# Patient Record
Sex: Male | Born: 1960 | Race: White | Hispanic: No | Smoking: Former smoker
Health system: Southern US, Community
[De-identification: ages and names within clinical notes are randomized; demographics above are authoritative.]

## PROBLEM LIST (undated history)

## (undated) DIAGNOSIS — I10 Essential (primary) hypertension: Secondary | ICD-10-CM

## (undated) HISTORY — DX: Essential (primary) hypertension: I10

---

## 1999-11-24 ENCOUNTER — Emergency Department (HOSPITAL_COMMUNITY): Admission: EM | Admit: 1999-11-24 | Discharge: 1999-11-24 | Payer: Self-pay | Admitting: Emergency Medicine

## 1999-11-30 ENCOUNTER — Ambulatory Visit (HOSPITAL_BASED_OUTPATIENT_CLINIC_OR_DEPARTMENT_OTHER): Admission: RE | Admit: 1999-11-30 | Discharge: 1999-11-30 | Payer: Self-pay | Admitting: *Deleted

## 2015-04-25 ENCOUNTER — Encounter (HOSPITAL_COMMUNITY): Payer: Self-pay | Admitting: *Deleted

## 2015-04-25 ENCOUNTER — Emergency Department (HOSPITAL_COMMUNITY)
Admission: EM | Admit: 2015-04-25 | Discharge: 2015-04-25 | Disposition: A | Discharge status: Home / Self Care | Payer: Managed Care, Other (non HMO) | Source: Home / Self Care | Attending: Family Medicine | Admitting: Family Medicine

## 2015-04-25 DIAGNOSIS — L255 Unspecified contact dermatitis due to plants, except food: Secondary | ICD-10-CM | POA: Diagnosis not present

## 2015-04-25 MED ORDER — TRIAMCINOLONE ACETONIDE 40 MG/ML IJ SUSP
INTRAMUSCULAR | Status: AC
  Filled 2015-04-25: qty 1

## 2015-04-25 MED ORDER — METHYLPREDNISOLONE ACETATE 80 MG/ML IJ SUSP
INTRAMUSCULAR | Status: AC
  Filled 2015-04-25: qty 1

## 2015-04-25 MED ORDER — TRIAMCINOLONE ACETONIDE 40 MG/ML IJ SUSP
40.0000 mg | Freq: Once | INTRAMUSCULAR | Status: AC
  Administered 2015-04-25: 40 mg via INTRAMUSCULAR

## 2015-04-25 MED ORDER — METHYLPREDNISOLONE ACETATE 40 MG/ML IJ SUSP
80.0000 mg | Freq: Once | INTRAMUSCULAR | Status: AC
  Administered 2015-04-25: 80 mg via INTRAMUSCULAR

## 2015-04-25 MED ORDER — HYDROXYZINE HCL 25 MG PO TABS: 25.0000 mg | ORAL_TABLET | Freq: Four times a day (QID) | ORAL | Status: AC

## 2015-04-25 NOTE — ED Notes (Signed)
Pt reports   Symptoms  Of   Rash      X  3-4   Weeks      Symptoms  Not  releived  By  [facility]   meds        Pt  Sitting  Upright on the  Exam table  Speaking  In   Complete   sentances

## 2015-04-25 NOTE — ED Provider Notes (Signed)
CSN: [id number]     Arrival date & time 04/25/15  1304 History   First MD Initiated Contact with Patient 04/25/15 1329     Chief Complaint  Patient presents with  . Rash   (Consider location/radiation/quality/duration/timing/severity/associated sxs/prior Treatment) Patient is a 54 y.o. male presenting with rash. The history is provided by the patient.  Rash Location:  Full body Quality: dryness, itchiness and redness   Severity:  Mild Onset quality:  Gradual Duration:  3 weeks Progression:  Unchanged Chronicity:  New Context comment:  Onset after mowing grass sev weeks ag Ineffective treatments:  Topical steroids Associated symptoms: no fever     History reviewed. No pertinent past medical history. History reviewed. No pertinent past surgical history. History reviewed. No pertinent family history. History  Substance Use Topics  . Smoking status: Never Smoker   . Smokeless tobacco: Not on file  . Alcohol Use: Yes    Review of Systems  Constitutional: Negative.  Negative for fever.  Skin: Positive for rash.    Allergies  Review of patient's allergies indicates no known allergies.  Home Medications   Prior to Admission medications   Medication Sig Start Date End Date Taking? Authorizing Provider  hydrOXYzine (ATARAX/VISTARIL) 25 MG tablet Take 1 tablet (25 mg total) by mouth every 6 (six) hours. Prn itching 04/25/15   Linna Hoff, MD   BP 173/128 mmHg  Pulse 101  Temp(Src) 98.8 F (37.1 C) (Oral)  Resp 16  SpO2 96% Physical Exam  Constitutional: He is oriented to person, place, and time. He appears well-developed and well-nourished.  Neck: Normal range of motion. Neck supple.  Musculoskeletal: He exhibits no tenderness.  Neurological: He is alert and oriented to person, place, and time.  Skin: Skin is warm and dry. Rash noted. There is erythema.  Scattered patchy erythema pruritis .  Nursing note and vitals reviewed.   ED Course  Procedures (including  critical care time) Labs Review Labs Reviewed - No data to display  Imaging Review No results found.   MDM   1. Contact dermatitis due to plants, except food       Linna Hoff, MD 04/25/15 1339

## 2020-03-12 ENCOUNTER — Ambulatory Visit (HOSPITAL_COMMUNITY)
Admission: EM | Admit: 2020-03-12 | Discharge: 2020-03-12 | Disposition: A | Discharge status: Home / Self Care | Payer: 59 | Attending: Registered Nurse | Admitting: Registered Nurse

## 2020-03-12 ENCOUNTER — Other Ambulatory Visit: Payer: Self-pay

## 2020-03-12 DIAGNOSIS — F1020 Alcohol dependence, uncomplicated: Secondary | ICD-10-CM | POA: Diagnosis not present

## 2020-03-12 NOTE — BH Assessment (Signed)
Comprehensive Clinical Assessment (CCA) Note  03/12/2020 Barry Peterson [id number]  Patient is a 59 year old male presenting voluntarily to [facility] for assessment at request of his 2 adult daughters. Patient reports depressive symptoms x 1 month. He states "I just lay on the couch and don't want to get up." Patient denies SI/ HI/ AVH. Patient reports drinking 4 alcoholic beverages daily after work and more on weekends. He denies any other substance use. Patient denies any prior mental health or substance use treatment. Patient denies trauma history.  Per Assunta Found, NP patient does not meet in patient criteria and is psych cleared. Patient to follow up with outpatient.  Visit Diagnosis:    MDD, single episode, moderate   ICD-10-CM   1. Alcohol use disorder, moderate, in controlled environment, dependence (HCC)  F10.20      CCA Screening, Triage and Referral (STR)  Patient Reported Information How did you hear about [place]? Family/Friend  Referral name: No data recorded Referral phone number: No data recorded  Whom do you see for routine medical problems? Primary Care  Practice/Facility Name: Dr. Lupe Carney at [facility]  Practice/Facility Phone Number: No data recorded Name of Contact: No data recorded Contact Number: No data recorded Contact Fax Number: No data recorded Prescriber Name: No data recorded Prescriber Address (if known): No data recorded  What Is the Reason for Your Visit/Call Today? Depression  How Long Has This Been Causing You Problems? 1 wk - 1 month  What Do You Feel Would Help You the Most Today? Therapy   Have You Recently Been in Any Inpatient Treatment (Hospital/Detox/Crisis Center/28-Day Program)? No  Name/Location of Program/Hospital:No data recorded How Long Were You There? No data recorded When Were You Discharged? No data recorded  Have You Ever Received Services From [facility] Before? No  Who Do You See at [facility]? No data  recorded  Have You Recently Had Any Thoughts About Hurting Yourself? No  Are You Planning to Commit Suicide/Harm Yourself At This time? No   Have you Recently Had Thoughts About Hurting Someone Karolee Ohs? No  Explanation: No data recorded  Have You Used Any Alcohol or Drugs in the Past 24 Hours? Yes  How Long Ago Did You Use Drugs or Alcohol? 1900  What Did You Use and How Much? 4 drinks   Do You Currently Have a Therapist/Psychiatrist? No  Name of Therapist/Psychiatrist: No data recorded  Have You Been Recently Discharged From Any Office Practice or Programs? No  Explanation of Discharge From Practice/Program: No data recorded    CCA Screening Triage Referral Assessment Type of Contact: Face-to-Face  Is this Initial or Reassessment? No data recorded Date Telepsych consult ordered in [facility]:  No data recorded Time Telepsych consult ordered in [facility]:  No data recorded  Patient Reported Information Reviewed? Yes  Patient Left Without Being Seen? No data recorded Reason for Not Completing Assessment: No data recorded  Collateral Involvement: daughters Mylo Red and Isabel Caprice   Does Patient Have a Automotive engineer Guardian? No data recorded Name and Contact of Legal Guardian: No data recorded If Minor and Not Living with Parent(s), Who has Custody? No data recorded Is CPS involved or ever been involved? Never  Is APS involved or ever been involved? Never   Patient Determined To Be At Risk for Harm To Self or Others Based on Review of Patient Reported Information or Presenting Complaint? No  Method: No data recorded Availability of Means: No data recorded Intent: No  data recorded Notification Required: No data recorded Additional Information for Danger to Others Potential: No data recorded Additional Comments for Danger to Others Potential: No data recorded Are There Guns or Other Weapons in Your Home? No data recorded Types of Guns/Weapons: No data  recorded Are These Weapons Safely Secured?                            No data recorded Who Could Verify You Are Able To Have These Secured: No data recorded Do You Have any Outstanding Charges, Pending Court Dates, Parole/Probation? No data recorded Contacted To Inform of Risk of Harm To Self or Others: No data recorded  Location of Assessment: [facility]   Does Patient Present under Involuntary Commitment? No  IVC Papers Initial File Date: No data recorded  [place] of Residence: [place]   Patient Currently Receiving the Following Services: Not Receiving Services   Determination of Need: Routine (7 days)   Options For Referral: Outpatient Therapy     CCA Biopsychosocial  Intake/Chief Complaint:  CCA Intake With Chief Complaint CCA Part Two Date: 03/12/20 Chief Complaint/Presenting Problem: Depression Patient's Currently Reported Symptoms/Problems: NA Individual's Strengths: NA Individual's Preferences: NA Individual's Abilities: NA Type of Services Patient Feels Are Needed: therapy  Mental Health Symptoms Depression:  Depression: Change in energy/activity, Difficulty Concentrating, Fatigue, Increase/decrease in appetite, Irritability, Sleep (too much or little), Duration of symptoms greater than two weeks  Mania:  Mania: None  Anxiety:   Anxiety: None  Psychosis:  Psychosis: None  Trauma:  Trauma: None  Obsessions:  Obsessions: None  Compulsions:  Compulsions: None  Inattention:  Inattention: None  Hyperactivity/Impulsivity:  Hyperactivity/Impulsivity: N/A  Oppositional/Defiant Behaviors:  Oppositional/Defiant Behaviors: N/A  Emotional Irregularity:  Emotional Irregularity: N/A  Other Mood/Personality Symptoms:  Other Mood/Personality Symptoms: Depressed mood   Mental Status Exam Appearance and self-care  Stature:  Stature: Average  Weight:  Weight: Average weight  Clothing:  Clothing: Casual  Grooming:  Grooming: Well-groomed  Cosmetic use:   Cosmetic Use: None  Posture/gait:  Posture/Gait: Normal  Motor activity:  Motor Activity: Not Remarkable  Sensorium  Attention:  Attention: Normal  Concentration:  Concentration: Normal  Orientation:  Orientation: X5  Recall/memory:  Recall/Memory: Normal  Affect and Mood  Affect:  Affect: Depressed  Mood:  Mood: Depressed  Relating  Eye contact:  Eye Contact: Avoided  Facial expression:  Facial Expression: Depressed  Attitude toward examiner:  Attitude Toward Examiner: Cooperative  Thought and Language  Speech flow: Speech Flow: Clear and Coherent  Thought content:  Thought Content: Appropriate to Mood and Circumstances  Preoccupation:  Preoccupations: None  Hallucinations:  Hallucinations: None  Organization:     Transport planner of Knowledge:  Fund of Knowledge: Average  Intelligence:  Intelligence: Average  Abstraction:  Abstraction: Normal  Judgement:  Judgement: Normal  Reality Testing:  Reality Testing: Realistic  Insight:  Insight: Fair  Decision Making:  Decision Making: Normal  Social Functioning  Social Maturity:  Social Maturity: Isolates  Social Judgement:  Social Judgement: Normal  Stress  Stressors:  Stressors: Family conflict, Grief/losses, Work  Coping Ability:  Coping Ability: Normal  Skill Deficits:  Skill Deficits: Self-care  Supports:  Supports: Friends/Service system     Religion: Religion/Spirituality Are You A Religious Person?:  (NA) How Might This Affect Treatment?: NA  Leisure/Recreation: Leisure / Recreation Do You Have Hobbies?:  (not assessed)  Exercise/Diet: Exercise/Diet Do You Exercise?:  (not assessed)  Have You Gained or Lost A Significant Amount of Weight in the Past Six Months?: No Do You Follow a Special Diet?:  (not assessed) Do You Have Any Trouble Sleeping?: Yes Explanation of Sleeping Difficulties: sometimes very little, other times sleeps all day   CCA Employment/Education  Employment/Work  Situation: Employment / Work Situation Employment situation: Employed 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 How long has patient been employed?: not assessed Patient's job has been impacted by current illness: No What is the longest time patient has a held a job?: not assessed Where was the patient employed at that time?: not assessed Has patient ever been in the [employer]?: No  Education: Education Is Patient Currently Attending School?: No Name of [employer] School: not assessed Did Garment/textile technologist From [employer]?: Yes Did Designer, television/film set?: No Did You Have An Individualized Education Program (IIEP):  (NA) Did You Have Any Difficulty At School?:  (NA) Patient's Education Has Been Impacted by Current Illness:  (NA)   CCA Family/Childhood History  Family and Relationship History: Family history Marital status: Widowed Widowed, when?: 7 yeras ago Are you sexually active?:  (not assessed) What is your sexual orientation?: not assessed Has your sexual activity been affected by drugs, alcohol, medication, or emotional stress?: not assessed Does patient have children?: Yes How many children?: 2 How is patient's relationship with their children?: children are supportive and visit on the weekends  Childhood History:  Childhood History By whom was/is the patient raised?:  (not assessed) Additional childhood history information: NA Description of patient's relationship with caregiver when they were a child: NA Patient's description of current relationship with people who raised him/her: NA How were you disciplined when you got in trouble as a child/adolescent?: NA Does patient have siblings?:  (NA) Did patient suffer any verbal/emotional/physical/sexual abuse as a child?: No Did patient suffer from severe childhood neglect?: No Has patient ever been sexually abused/assaulted/raped as an adolescent or adult?: No Was the patient ever a victim of a crime or a  disaster?: No Witnessed domestic violence?:  (not assessed) Has patient been affected by domestic violence as an adult?:  (not assessed)  Child/Adolescent Assessment:     CCA Substance Use  Alcohol/Drug Use: Alcohol / Drug Use Pain Medications: see MAR Prescriptions: see MAR Over the Counter: see MAR History of alcohol / drug use?: Yes Substance #1 Name of Substance 1: Alcohol 1 - Age of First Use: UTA 1 - Amount (size/oz): 4 beers 1 - Frequency: daily 1 - Duration: 1 month 1 - Last Use / Amount: 4 beers 6/16                       ASAM's:  Six Dimensions of Multidimensional Assessment  Dimension 1:  Acute Intoxication and/or Withdrawal Potential:      Dimension 2:  Biomedical Conditions and Complications:      Dimension 3:  Emotional, Behavioral, or Cognitive Conditions and Complications:     Dimension 4:  Readiness to Change:     Dimension 5:  Relapse, Continued use, or Continued Problem Potential:     Dimension 6:  Recovery/Living Environment:     ASAM Severity Score:    ASAM Recommended Level of Treatment:     Substance use Disorder (SUD)    Recommendations for Services/Supports/Treatments: Recommendations for Services/Supports/Treatments Recommendations For Services/Supports/Treatments: Individual Therapy  DSM5 Diagnoses: Patient Active Problem List   Diagnosis Date Noted  . Alcohol use disorder, moderate, in controlled environment,  dependence (HCC) 03/12/2020    Patient Centered Plan: Patient is on the following Treatment Plan(s):      Celedonio Miyamoto

## 2020-03-12 NOTE — ED Provider Notes (Signed)
Behavioral Health Medical Screening Exam  Barry Peterson is a 59 y.o. male patient presented to [facility] as a walk in brought in by his daughters with complaints that his daughters have been concerned and think something is wrong with him.  Patient stating that there is some isolation but as he has gotten older he doesn't go out as much.  States that he drinks 2-3 beers every evening after work and drinks more on week ends; but denies alcohol withdrawal symptoms.  Patient states that he has some depression but doesn't think that is the problem.  Patient gave permission to speak to his daughters for collateral information (sitting in lobby).   During evaluation Barry Peterson is alert/oriented x 4; calm/cooperative; and mood is congruent with affect.  He does not appear to be responding to internal/external stimuli or delusional thoughts.  Patient denies suicidal/self-harm/homicidal ideation, psychosis, and paranoia.  Patient answered question appropriately. Patient daughters states that patient has increased alcohol intake and feel that he may be doing other drugs.  States that he has been out of work for 2 weeks and has brought homeless women home to stay with him.  States he is not the person he use to be; "he is not cleaning his house, he never wants to go out.  He is just different. But the change has been over a period of time."  Both daughters don't feel that patient is a harm to himself or others just want him to get help for alcohol and substance use.      Total Time spent with patient: 45 minutes  Psychiatric Specialty Exam  Presentation  General Appearance:Appropriate for Environment;Casual  Eye Contact:Good  Speech:Clear and Coherent;Normal Rate  Speech Volume:Normal  Handedness:Right   Mood and Affect  Mood:Anxious;Depressed (Patient rating depression and anxiety 2-3 on scale 0/none and 10/worst)  Affect:Appropriate;Congruent   Thought Process  Thought Processes:Coherent;Goal  Directed  Descriptions of Associations:Intact  Orientation:Full (Time, Place and Person)  Thought Content:Logical;WDL  Hallucinations:None  Ideas of Reference:None  Suicidal Thoughts:No  Homicidal Thoughts:No   Sensorium  Memory:Immediate Good;Recent Good;Remote Good  Judgment:Intact  Insight:Good   Executive Functions  Concentration:Good  Attention Span:Good  Recall:Good  Fund of Knowledge:Good  Language:Good   Psychomotor Activity  Psychomotor Activity:Normal   Assets  Assets:Communication Skills;Desire for Improvement;Housing;Social Support;Financial Resources/Insurance   Sleep  Sleep:Fair  Number of hours: No data recorded  Physical Exam: Physical Exam ROS Blood pressure 110/82, pulse (!) 106, temperature (!) 97.1 F (36.2 C), temperature source Tympanic, resp. rate 20, height 5\' 10"  (1.778 m), weight 179 lb (81.2 kg), SpO2 98 %. Body mass index is 25.68 kg/m.  Musculoskeletal: Strength & Muscle Tone: within normal limits Gait & Station: normal Patient leans: N/A   Recommendations:  Outpatient psychiatric services and substance use services  Based on my evaluation the patient does not appear to have an emergency medical condition.    Follow-up Information    [place] [facility] Follow up.   Specialty: Behavioral Health Contact information: [address] [place] [phone]             Disposition:  Psychiatrically cleared No evidence of imminent risk to self or others at present.   Patient does not meet criteria for psychiatric inpatient admission. Supportive therapy provided about ongoing stressors. Discussed crisis plan, support from social network, calling 911, coming to the Emergency Department, and calling Suicide Hotline.  Timiyah Romito, NP 03/12/2020, 1:19 PM

## 2020-03-12 NOTE — ED Notes (Signed)
Patient belongings in locker 29 

## 2020-03-12 NOTE — Discharge Instructions (Addendum)
Patient to follow up with [facility].

## 2020-03-27 ENCOUNTER — Other Ambulatory Visit: Payer: Self-pay

## 2020-03-27 ENCOUNTER — Ambulatory Visit (INDEPENDENT_AMBULATORY_CARE_PROVIDER_SITE_OTHER): Payer: 59 | Admitting: Licensed Clinical Social Worker

## 2020-03-27 ENCOUNTER — Encounter (HOSPITAL_COMMUNITY): Payer: Self-pay | Admitting: Licensed Clinical Social Worker

## 2020-03-27 DIAGNOSIS — F1020 Alcohol dependence, uncomplicated: Secondary | ICD-10-CM | POA: Diagnosis not present

## 2020-03-27 DIAGNOSIS — F320 Major depressive disorder, single episode, mild: Secondary | ICD-10-CM | POA: Diagnosis not present

## 2020-03-27 NOTE — Progress Notes (Signed)
Comprehensive Clinical Assessment (CCA) Note  03/27/2020 Barry Peterson [id number]  Visit Diagnosis:      ICD-10-CM   1. Current mild episode of major depressive disorder, unspecified whether recurrent ([employer])  F32.0   2. Alcohol use disorder, moderate, in controlled environment, dependence [facility])  F10.20     Client is a 59 year old Male. Client is referred by [facility] for a MDD, and alcohol use disorder.   Client states mental health symptoms as evidenced by  Drinking daily (3 to 4 beers), Racing thoughts, sadness, anxious, lack of social interests, insomnia, Irresponsibility (3 months ago brought home homeless person and had sexual relationship throughout a period of 1 week), grief, Fatigue, and decrease concentration  Client denies suicidal and homicidal ideations  Client denies hallucinations and delusions   Client was screened for the following SDOH: smoking, exercise, family/social interest, depression and drinking   Assessment Information that integrates subjective and objective details with a therapist's professional interpretation:   LCSW and pt met for 60 min initial evaluation. Barry Peterson was alert and oriented x 5, dressed in work Nurse, mental health, and had flat affect/mood. Pt engaged in assessment.    Pt reports that pt was brought in by his daughter to [facility] 2 weeks ago. Barry Peterson states that he has made some poor choices in the past explaining that three months ago he brought home a homeless person gave her housing and did state he had sexual relations with her, however enforces the fact he used safe sex practices. Barry Peterson states that his daughter believe he has drug problems because the lady he brought home smoked cocaine. Pt declines use for any other substance besides alcohol. LCSW discussed pt need for continued drinking states he has 3 to 4 beers after work Mon-Friday with an increase of intake on the weekends 4 to 6 which reoccurs weekly. [name] reports he does not drink enough to have withdrawal  symptoms and very rarely gets "hungover". Primary need for Won is to process the grief he feels for his wife he lost 7 years ago. States " I really just have not been able to get over the thought of it" Pt continued to say that "Even when I had brought home the homeless lady, I sometimes called her Barry Peterson my wife's name".    Client meets criteria for Mild depression, alcohol use disorder, and unspecified bipolar disorder.     Client states use of the following substances: alcohol   Therapist addressed (substance use) concern, although client meets criteria, he/ she reports they do not wish to pursue tx at this time although therapist feels they would benefit from [place] counseling. (IF CLIENT HAS A S/A PROBLEM)   Treatment recommendations are include plan Pt wants to process spouses death, creating coping mechanisms, and address depression.   Elevate mood and show evidence of usual energy, activities, and socialization level.; Develop healthy interpersonal relationships that lead to alleviation and help prevent the relapse of depression symptoms; Develop healthy cognitive patterns and beliefs about self and the world that lead to alleviation and help prevent the relapse of depression symptoms; Appropriately grieve the loss in order to normalize mood and to return to previous adaptive level of functioning.; Appropriately grieve the loss of a spouse in order to normalize mood and to return to previous adaptive level of functioning. Verbally identify, if possible, the source of depressed mood; Discuss the nature of the relationship with the deceased significant other, reminiscing about a time spent together; Begin to experience sadness in session while discussing  the disappointment related to the loss or pain from the past; Verbalize any unresolved grief issues that may be contributing to depression.   Objective: Decrease PHQ-9 to below a 10, properly summarize events leading up to wife's death, increase  daily walking to 2-3 times weekly, write down list of depressive thoughts/triggers.      Clinician assisted client with scheduling the following appointments: 3 weeks. Clinician details of appointment.    Client was in agreement with treatment recommendations.  CCA Screening, Triage and Referral (STR)  Patient Reported Information How did you hear about [place]? Family/Friend   Whom do you see for routine medical problems? Primary Care  Practice/Facility Name: Dr. Donnie Coffin at [facility]   What Is the Reason for Your Visit/Call Today? Depression  How Long Has This Been Causing You Problems? 1 wk - 1 month  What Do You Feel Would Help You the Most Today? Therapy   Have You Recently Been in Any Inpatient Treatment (Hospital/Detox/Crisis Center/28-Day Program)? No   Have You Ever Received Services From [employer] Before? No   Have You Recently Had Any Thoughts About Hurting Yourself? No  Are You Planning to Commit Suicide/Harm Yourself At This time? No   Have you Recently Had Thoughts About [place]? No   Have You Used Any Alcohol or Drugs in the Past 24 Hours? Yes  How Long Ago Did You Use Drugs or Alcohol? 1900  What Did You Use and How Much? 4 drinks   Do You Currently Have a Therapist/Psychiatrist? No   Have You Been Recently Discharged From Any Office Practice or Programs? No     CCA Screening Triage Referral Assessment Type of Contact: Face-to-Face   Patient Reported Information Reviewed? Yes   Collateral Involvement: daughters Barry Peterson and Barry Peterson   Is CPS involved or ever been involved? Never  Is APS involved or ever been involved? Never   Patient Determined To Be At Risk for Harm To Self or Others Based on Review of Patient Reported Information or Presenting Complaint? No    Location of Assessment: [facility]   Does Patient Present under Involuntary Commitment? No  IVC Papers Initial File  Date: No data recorded  [place] of Residence: [place]   Patient Currently Receiving the Following Services: Not Receiving Services   Determination of Need: Routine (7 days)   Options For Referral: Outpatient Therapy     CCA Biopsychosocial  Intake/Chief Complaint:  CCA Intake With Chief Complaint CCA Part Two Date: 03/12/20 Chief Complaint/Presenting Problem: Depression Patient's Currently Reported Symptoms/Problems: Racing thoughts, sadness, anxious, lack of social intrests, insomnia, Irresponsibility (3 months ago brought home homeless person and had sexual relationship throughout a period of 1 week) Individual's Strengths: NA Individual's Preferences: NA Individual's Abilities: NA Type of Services Patient Feels Are Needed: therapy  Mental Health Symptoms Depression:  Depression: Change in energy/activity, Difficulty Concentrating, Fatigue, Increase/decrease in appetite, Irritability, Sleep (too much or little), Duration of symptoms greater than two weeks  Mania:  Mania: None  Anxiety:   Anxiety: None  Psychosis:  Psychosis: None  Trauma:  Trauma: None  Obsessions:  Obsessions: None  Compulsions:  Compulsions: None  Inattention:  Inattention: None  Hyperactivity/Impulsivity:  Hyperactivity/Impulsivity: N/A  Oppositional/Defiant Behaviors:  Oppositional/Defiant Behaviors: N/A  Emotional Irregularity:  Emotional Irregularity: N/A  Other Mood/Personality Symptoms:  Other Mood/Personality Symptoms: Depressed mood   Mental Status Exam Appearance and self-care  Stature:  Stature: Average  Weight:  Weight: Average weight  Clothing:  Clothing: Casual  Grooming:  Grooming: Well-groomed  Cosmetic use:  Cosmetic Use: None  Posture/gait:  Posture/Gait: Normal  Motor activity:  Motor Activity: Not Remarkable  Sensorium  Attention:  Attention: Normal  Concentration:  Concentration: Normal  Orientation:  Orientation: X5  Recall/memory:  Recall/Memory: Normal  Affect and Mood   Affect:  Affect: Depressed  Mood:  Mood: Depressed  Relating  Eye contact:  Eye Contact: Avoided  Facial expression:  Facial Expression: Depressed  Attitude toward examiner:  Attitude Toward Examiner: Cooperative  Thought and Language  Speech flow: Speech Flow: Clear and Coherent  Thought content:  Thought Content: Appropriate to Mood and Circumstances  Preoccupation:  Preoccupations: None  Hallucinations:  Hallucinations: None  Organization:     Transport planner of Knowledge:  Fund of Knowledge: Average  Intelligence:  Intelligence: Average  Abstraction:  Abstraction: Normal  Judgement:  Judgement: Normal  Reality Testing:  Reality Testing: Realistic  Insight:  Insight: Fair  Decision Making:  Decision Making: Normal  Social Functioning  Social Maturity:  Social Maturity: Isolates  Social Judgement:  Social Judgement: Normal  Stress  Stressors:  Stressors: Family conflict, Grief/losses, Work  Coping Ability:  Coping Ability: Normal  Skill Deficits:  Skill Deficits: Self-care  Supports:  Supports: Friends/Service system     Religion: Religion/Spirituality Are You A Religious Person?:  (NA) How Might This Affect Treatment?: NA  Leisure/Recreation: Leisure / Recreation Do You Have Hobbies?:  (not assessed)  Exercise/Diet: Exercise/Diet Do You Exercise?:  (not assessed) Have You Gained or Lost A Significant Amount of Weight in the Past Six Months?: No Do You Follow a Special Diet?:  (not assessed) Do You Have Any Trouble Sleeping?: Yes Explanation of Sleeping Difficulties: sometimes very little, other times sleeps all day   CCA Employment/Education  Employment/Work Situation: Employment / Work Situation Employment situation: Employed 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, systems How long has patient been employed?: not assessed Patient's job has been impacted by current illness: No What is the longest time patient has a held a job?: not  assessed Where was the patient employed at that time?: not assessed Has patient ever been in the [employer]?: No  Education: Education Name of [employer]: not assessed Did Teacher, adult education From [employer]?: Yes Did Heritage manager?: No Did You Have An Individualized Education Program (IIEP):  (NA) Did You Have Any Difficulty At School?:  (NA)   CCA Family/Childhood History  Family and Relationship History: Family history Marital status: Widowed Widowed, when?: 7 yeras ago Are you sexually active?:  (not assessed) What is your sexual orientation?: not assessed Has your sexual activity been affected by drugs, alcohol, medication, or emotional stress?: not assessed Does patient have children?: Yes How many children?: 2 How is patient's relationship with their children?: children are supportive and visit on the weekends  Childhood History:  Childhood History By whom was/is the patient raised?:  (not assessed) Additional childhood history information: NA Description of patient's relationship with caregiver when they were a child: NA Patient's description of current relationship with people who raised him/her: NA How were you disciplined when you got in trouble as a child/adolescent?: NA Does patient have siblings?:  (NA) Did patient suffer any verbal/emotional/physical/sexual abuse as a child?: No Did patient suffer from severe childhood neglect?: No Has patient ever been sexually abused/assaulted/raped as an adolescent or adult?: No Was the patient ever a victim of a crime or a disaster?: No Witnessed domestic violence?:  (not assessed)  Has patient been affected by domestic violence as an adult?:  (not assessed)  Child/Adolescent Assessment:     CCA Substance Use  Alcohol/Drug Use: Alcohol / Drug Use Pain Medications: see MAR Prescriptions: see MAR Over the Counter: see MAR History of alcohol / drug use?: Yes Substance #1 Name of Substance 1: Alcohol 1 - Age  of First Use: UTA 1 - Amount (size/oz): 4 beers 1 - Frequency: daily 1 - Duration: 1 month 1 - Last Use / Amount: 4 beers 6/16      Recommendations for Services/Supports/Treatments: Recommendations for Services/Supports/Treatments Recommendations For Services/Supports/Treatments: Individual Therapy  DSM5 Diagnoses: Patient Active Problem List   Diagnosis Date Noted  . Alcohol use disorder, moderate, in controlled environment, dependence ([place]) 03/12/2020    Patient Centered Plan: Patient is on the following Treatment Plan(s):  Depression     Dory Horn

## 2020-03-27 NOTE — Patient Instructions (Signed)
Living With Depression Everyone experiences occasional disappointment, sadness, and loss in their lives. When you are feeling down, blue, or sad for at least 2 weeks in a row, it may mean that you have depression. Depression can affect your thoughts and feelings, relationships, daily activities, and physical health. It is caused by changes in the way your brain functions. If you receive a diagnosis of depression, your health care provider will tell you which type of depression you have and what treatment options are available to you. If you are living with depression, there are ways to help you recover from it and also ways to prevent it from coming back. How to cope with lifestyle changes Coping with stress     Stress is your body's reaction to life changes and events, both good and bad. Stressful situations may include:  Getting married.  The death of a spouse.  Losing a job.  Retiring.  Having a baby. Stress can last just a few hours or it can be ongoing. Stress can play a major role in depression, so it is important to learn both how to cope with stress and how to think about it differently. Talk with your health care provider or a counselor if you would like to learn more about stress reduction. He or she may suggest some stress reduction techniques, such as:  Music therapy. This can include creating music or listening to music. Choose music that you enjoy and that inspires you.  Mindfulness-based meditation. This kind of meditation can be done while sitting or walking. It involves being aware of your normal breaths, rather than trying to control your breathing.  Centering prayer. This is a kind of meditation that involves focusing on a spiritual word or phrase. Choose a word, phrase, or sacred image that is meaningful to you and that brings you peace.  Deep breathing. To do this, expand your stomach and inhale slowly through your nose. Hold your breath for 3-5 seconds, then exhale  slowly, allowing your stomach muscles to relax.  Muscle relaxation. This involves intentionally tensing muscles then relaxing them. Choose a stress reduction technique that fits your lifestyle and personality. Stress reduction techniques take time and practice to develop. Set aside 5-15 minutes a day to do them. Therapists can offer training in these techniques. The training may be covered by some insurance plans. Other things you can do to manage stress include:  Keeping a stress diary. This can help you learn what triggers your stress and ways to control your response.  Understanding what your limits are and saying no to requests or events that lead to a schedule that is too full.  Thinking about how you respond to certain situations. You may not be able to control everything, but you can control how you react.  Adding humor to your life by watching funny films or TV shows.  Making time for activities that help you relax and not feeling guilty about spending your time this way.  Medicines Your health care provider may suggest certain medicines if he or she feels that they will help improve your condition. Avoid using alcohol and other substances that may prevent your medicines from working properly (may interact). It is also important to:  Talk with your pharmacist or health care provider about all the medicines that you take, their possible side effects, and what medicines are safe to take together.  Make it your goal to take part in all treatment decisions (shared decision-making). This includes giving input on   the side effects of medicines. It is best if shared decision-making with your health care provider is part of your total treatment plan. If your health care provider prescribes a medicine, you may not notice the full benefits of it for 4-8 weeks. Most people who are treated for depression need to be on medicine for at least 6-12 months after they feel better. If you are taking  medicines as part of your treatment, do not stop taking medicines without first talking to your health care provider. You may need to have the medicine slowly decreased (tapered) over time to decrease the risk of harmful side effects. Relationships Your health care provider may suggest family therapy along with individual therapy and drug therapy. While there may not be family problems that are causing you to feel depressed, it is still important to make sure your family learns as much as they can about your mental health. Having your family's support can help make your treatment successful. How to recognize changes in your condition Everyone has a different response to treatment for depression. Recovery from major depression happens when you have not had signs of major depression for two months. This may mean that you will start to:  Have more interest in doing activities.  Feel less hopeless than you did 2 months ago.  Have more energy.  Overeat less often, or have better or improving appetite.  Have better concentration. Your health care provider will work with you to decide the next steps in your recovery. It is also important to recognize when your condition is getting worse. Watch for these signs:  Having fatigue or low energy.  Eating too much or too little.  Sleeping too much or too little.  Feeling restless, agitated, or hopeless.  Having trouble concentrating or making decisions.  Having unexplained physical complaints.  Feeling irritable, angry, or aggressive. Get help as soon as you or your family members notice these symptoms coming back. How to get support and help from others How to talk with friends and family members about your condition  Talking to friends and family members about your condition can provide you with one way to get support and guidance. Reach out to trusted friends or family members, explain your symptoms to them, and let them know that you are  working with a health care provider to treat your depression. Financial resources Not all insurance plans cover mental health care, so it is important to check with your insurance carrier. If paying for co-pays or counseling services is a problem, search for a local or county mental health care center. They may be able to offer public mental health care services at low or no cost when you are not able to see a private health care provider. If you are taking medicine for depression, you may be able to get the generic form, which may be less expensive. Some makers of prescription medicines also offer help to patients who cannot afford the medicines they need. Follow these instructions at home:   Get the right amount and quality of sleep.  Cut down on using caffeine, tobacco, alcohol, and other potentially harmful substances.  Try to exercise, such as walking or lifting small weights.  Take over-the-counter and prescription medicines only as told by your health care provider.  Eat a healthy diet that includes plenty of vegetables, fruits, whole grains, low-fat dairy products, and lean protein. Do not eat a lot of foods that are high in solid fats, added sugars, or salt.    Keep all follow-up visits as told by your health care provider. This is important. Contact a health care provider if:  You stop taking your antidepressant medicines, and you have any of these symptoms: ? Nausea. ? Headache. ? Feeling lightheaded. ? Chills and body aches. ? Not being able to sleep (insomnia).  You or your friends and family think your depression is getting worse. Get help right away if:  You have thoughts of hurting yourself or others. If you ever feel like you may hurt yourself or others, or have thoughts about taking your own life, get help right away. You can go to your nearest emergency department or call:  Your local emergency services (911 in the [place]  A suicide crisis helpline, such as the  National Suicide Prevention Lifeline at 318-809-40211-937-598-3203. This is open 24-hours a day. Summary  If you are living with depression, there are ways to help you recover from it and also ways to prevent it from coming back.  Work with your health care team to create a management plan that includes counseling, stress management techniques, and healthy lifestyle habits. This information is not intended to replace advice given to you by your health care provider. Make sure you discuss any questions you have with your health care provider. Document Revised: 01/04/2019 Document Reviewed: 08/15/2016 Elsevier Patient Education  2020 Elsevier Inc.  Major Depressive Disorder, Adult Major depressive disorder (MDD) is a mental health condition. MDD often makes you feel sad, hopeless, or helpless. MDD can also cause symptoms in your body. MDD can affect your:  Work.  School.  Relationships.  Other normal activities. MDD can range from mild to very bad. It may occur once (single episode MDD). It can also occur many times (recurrent MDD). The main symptoms of MDD often include:  Feeling sad, depressed, or irritable most of the time.  Loss of interest. MDD symptoms also include:  Sleeping too much or too little.  Eating too much or too little.  A change in your weight.  Feeling tired (fatigue) or having low energy.  Feeling worthless.  Feeling guilty.  Trouble making decisions.  Trouble thinking clearly.  Thoughts of suicide or harming others.  Feeling weak.  Feeling agitated.  Keeping yourself from being around other people (isolation). Follow these instructions at home: Activity  Do these things as told by your doctor: ? Go back to your normal activities. ? Exercise regularly. ? Spend time outdoors. Alcohol  Talk with your doctor about how alcohol can affect your antidepressant medicines.  Do not drink alcohol. Or, limit how much alcohol you drink. ? This means no more  than 1 drink a day for nonpregnant women and 2 drinks a day for men. One drink equals one of these:  12 oz of beer.  5 oz of wine.  1 oz of hard liquor. General instructions  Take over-the-counter and prescription medicines only as told by your doctor.  Eat a healthy diet.  Get plenty of sleep.  Find activities that you enjoy. Make time to do them.  Think about joining a support group. Your doctor may be able to suggest a group for you.  Keep all follow-up visits as told by your doctor. This is important. Where to find more information:  The [employer] on Mental Illness: ? www.nami.org  [place] [employer] of Mental Health: ? http://www.maynard.net/www.nimh.nih.gov  National Suicide Prevention Lifeline: ? (830)381-97631-937-598-3203. This is free, 24-hour help. Contact a doctor if:  Your symptoms get worse.  You have  new symptoms. Get help right away if:  You self-harm.  You see, hear, taste, smell, or feel things that are not present (hallucinate). If you ever feel like you may hurt yourself or others, or have thoughts about taking your own life, get help right away. You can go to your nearest emergency department or call:  Your local emergency services (911 in the [place]  A suicide crisis helpline, such as the National Suicide Prevention Lifeline: ? 916-538-4333. This is open 24 hours a day. This information is not intended to replace advice given to you by your health care provider. Make sure you discuss any questions you have with your health care provider. Document Revised: 08/25/2017 Document Reviewed: 05/29/2016 Elsevier Patient Education  2020 [employer].  Alcohol Abuse and Dependence Information, Adult Alcohol is a widely available drug. People drink alcohol in different amounts. People who drink alcohol very often and in large amounts often have problems during and after drinking. They may develop what is called an alcohol use disorder. There are two main types of alcohol use  disorders:  Alcohol abuse. This is when you use alcohol too much or too often. You may use alcohol to make yourself feel happy or to reduce stress. You may have a hard time setting a limit on the amount you drink.  Alcohol dependence. This is when you use alcohol consistently for a period of time, and your body changes as a result. This can make it hard to stop drinking because you may start to feel sick or feel different when you do not use alcohol. These symptoms are known as withdrawal. How can alcohol abuse and dependence affect me? Alcohol abuse and dependence can have a negative effect on your life. Drinking too much can lead to addiction. You may feel like you need alcohol to function normally. You may drink alcohol before work in the morning, during the day, or as soon as you get home from work in the evening. These actions can result in:  Poor work performance.  Job loss.  Financial problems.  Car crashes or criminal charges from driving after drinking alcohol.  Problems in your relationships with friends and family.  Losing the trust and respect of coworkers, friends, and family. Drinking heavily over a long period of time can permanently damage your body and brain, and can cause lifelong health issues, such as:  Damage to your liver or pancreas.  Heart problems, high blood pressure, or stroke.  Certain cancers.  Decreased ability to fight infections.  Brain or nerve damage.  Depression.  Early (premature) death. If you are careless or you crave alcohol, it is easy to drink more than your body can handle (overdose). Alcohol overdose is a serious situation that requires hospitalization. It may lead to permanent injuries or death. What can increase my risk?  Having a family history of alcohol abuse.  Having depression or other mental health conditions.  Beginning to drink at an early age.  Binge drinking often.  Experiencing trauma, stress, and an unstable home  life during childhood.  Spending time with people who drink often. What actions can I take to prevent or manage alcohol abuse and dependence?  Do not drink alcohol if: ? Your health care provider tells you not to drink. ? You are pregnant, may be pregnant, or are planning to become pregnant.  If you drink alcohol: ? Limit how much you use to:  0-1 drink a day for women.  0-2 drinks a day for  men. ? Be aware of how much alcohol is in your drink. In the [place] one drink equals one 12 oz bottle of beer (355 mL), one 5 oz glass of wine (148 mL), or one 1 oz glass of hard liquor (44 mL).  Stop drinking if you have been drinking too much. This can be very hard to do if you are used to abusing alcohol. If you begin to have withdrawal symptoms, talk with your health care provider or a person that you trust. These symptoms may include anxiety, shaky hands, headache, nausea, sweating, or not being able to sleep.  Choose to drink nonalcoholic beverages in social gatherings and places where there may be alcohol. Activity  Spend more time on activities that you enjoy that do not involve alcohol, like hobbies or exercise.  Find healthy ways to cope with stress, such as exercise, meditation, or spending time with people you care about. General information  Talk to your family, coworkers, and friends about supporting you in your efforts to stop drinking. If they drink, ask them not to drink around you. Spend more time with people who do not drink alcohol.  If you think that you have an alcohol dependency problem: ? Tell friends or family about your concerns. ? Talk with your health care provider or another health professional about where to get help. ? Work with a Paramedic and a Network engineer. ? Consider joining a support group for people who struggle with alcohol abuse and dependence. Where to find support   Your health care provider.  SMART Recovery:  www.smartrecovery.org Therapy and support groups  Local treatment centers or chemical dependency counselors.  Local AA groups in your community: SalaryStart.tn Where to find more information  Centers for Disease Control and Prevention: FootballExhibition.com.br  [employer] on Alcohol Abuse and Alcoholism: BasicStudents.dk  Alcoholics Anonymous (AA): SalaryStart.tn Contact a health care provider if:  You drank more or for longer than you intended on more than one occasion.  You tried to stop drinking or to cut back on how much you drink, but you were not able to.  You often drink to the point of vomiting or passing out.  You want to drink so badly that you cannot think about anything else.  You have problems in your life due to drinking, but you continue to drink.  You keep drinking even though you feel anxious, depressed, or have experienced memory loss.  You have stopped doing the things you used to enjoy in order to drink.  You have to drink more than you used to in order to get the effect you want.  You experience anxiety, sweating, nausea, shakiness, and trouble sleeping when you try to stop drinking. Get help right away if:  You have thoughts about hurting yourself or others.  You have serious withdrawal symptoms, including: ? Confusion. ? Racing heart. ? High blood pressure. ? Fever. If you ever feel like you may hurt yourself or others, or have thoughts about taking your own life, get help right away. You can go to your nearest emergency department or call:  Your local emergency services (911 in the [place]  A suicide crisis helpline, such as the National Suicide Prevention Lifeline at 806-661-7337. This is open 24 hours a day. Summary  Alcohol abuse and dependence can have a negative effect on your life. Drinking too much or too often can lead to addiction.  If you drink alcohol, limit how much you use.  If you are having  trouble keeping your drinking under control,  find ways to change your behavior. Hobbies, calming activities, exercise, or support groups can help.  If you feel you need help with changing your drinking habits, talk with your health care provider, a good friend, or a therapist, or go to an AA group. This information is not intended to replace advice given to you by your health care provider. Make sure you discuss any questions you have with your health care provider. Document Revised: 01/01/2019 Document Reviewed: 11/20/2018 Elsevier Patient Education  2020 [employer].

## 2020-04-20 ENCOUNTER — Ambulatory Visit (INDEPENDENT_AMBULATORY_CARE_PROVIDER_SITE_OTHER): Payer: 59 | Admitting: Licensed Clinical Social Worker

## 2020-04-20 ENCOUNTER — Other Ambulatory Visit: Payer: Self-pay

## 2020-04-20 DIAGNOSIS — F1020 Alcohol dependence, uncomplicated: Secondary | ICD-10-CM | POA: Diagnosis not present

## 2020-04-20 DIAGNOSIS — F320 Major depressive disorder, single episode, mild: Secondary | ICD-10-CM

## 2020-04-20 NOTE — Progress Notes (Signed)
   THERAPIST PROGRESS NOTE  Session Time: 50  Therapist Response:    Subjective/Objective:    Pt was alert and oriented x 5. He was dressed casually and fairly groomed. He engaged well throughout assessment.   Pt reports an overall increase in depressive symptoms. States "Overall all I have started to feel like my old self". Although pt does report that he is still drinking daily with 1 day of binge drinking reported in the past week. He states that one of his friends he had not seen in a few years come and stop by the house on Saturday and pt reports that he woke up with minimal memory from the night before.  Matson also states that his daughters have started to stop by 1 x weekly with pt grandchildren which has brought an overall enjoyment back to his life. He reports that it has help to be able to process his wife's memory in therapy and pt even went through the narrative of the day his wife had passed away even becoming tearful at one point.    Assessment/plan: pt endorses symptoms of hopeless and fatigue. He has reported an overall increase in depressive symptoms since initial assessment 1 month ago. Pt still meets criteria for Current mild episode of major depression and alcohol use disorder moderate. Pt declined substance recovery resources and denies and suicidal or homicidal ideations   Participation Level: Active  Behavioral Response: CasualAlertNA  Type of Therapy: Individual Therapy  Treatment Goals addressed: Anxiety and Diagnosis: depression   Interventions: CBT and Supportive  Summary: EVER GUSTAFSON is a 59 y.o. male who presents with Depression.   Suicidal/Homicidal: NAwithout intent/plan  Therapist Response:   Plan: Return again in 4 weeks.  Diagnosis: Axis I: Major Depression, Recurrent severe    Weber Cooks, LCSW 04/20/2020

## 2020-05-21 ENCOUNTER — Ambulatory Visit (INDEPENDENT_AMBULATORY_CARE_PROVIDER_SITE_OTHER): Payer: 59 | Admitting: Licensed Clinical Social Worker

## 2020-05-21 ENCOUNTER — Other Ambulatory Visit: Payer: Self-pay

## 2020-05-21 DIAGNOSIS — F324 Major depressive disorder, single episode, in partial remission: Secondary | ICD-10-CM

## 2020-05-21 NOTE — Progress Notes (Signed)
   THERAPIST PROGRESS NOTE  Session Time: 22  Therapist Response:   Subjective/objective: Pt was alert and oriented x 5. He was dressed casually with a flat & depressed mood/affect. He was well engaged as evidence by note listed below.   Pt reports that he has been over doing "well". Overall pt has a decrease in depression symptoms. Barry Peterson states that he has decreased his drinking as well from daily to 3 to 4 times per week. New goal for pt is to increase his social interaction. LCSW asked pt to make a list of activities that he might to engaged in. He was agreeable to this plan moving forward.    Assessment/plan: Barry Peterson endorses a decrease in depression symptoms overall. 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 Plan moving forward attend 1 church event per week. Pt to journal activities that he would be interested in and pt will try to see his grandchildren 2 to 3 times per month  Participation Level: Active  Behavioral Response: CasualAlertDepressed  Type of Therapy: Individual Therapy  Treatment Goals addressed: Diagnosis: MDD   Interventions: Solution Focused  Summary: Barry Peterson is a 59 y.o. male who presents with MDD.   Suicidal/Homicidal: Nowithout intent/plan   Plan: Return again in 8 weeks.    Weber Cooks, LCSW 05/21/2020

## 2020-07-28 ENCOUNTER — Ambulatory Visit (HOSPITAL_COMMUNITY): Payer: 59 | Admitting: Licensed Clinical Social Worker

## 2021-01-27 ENCOUNTER — Ambulatory Visit (HOSPITAL_COMMUNITY)
Admission: EM | Admit: 2021-01-27 | Discharge: 2021-01-27 | Disposition: A | Discharge status: Home / Self Care | Payer: 59 | Attending: Internal Medicine | Admitting: Internal Medicine

## 2021-01-27 ENCOUNTER — Encounter (HOSPITAL_COMMUNITY): Payer: Self-pay | Admitting: Emergency Medicine

## 2021-01-27 ENCOUNTER — Other Ambulatory Visit: Payer: Self-pay

## 2021-01-27 DIAGNOSIS — I10 Essential (primary) hypertension: Secondary | ICD-10-CM

## 2021-01-27 DIAGNOSIS — R202 Paresthesia of skin: Secondary | ICD-10-CM

## 2021-01-27 MED ORDER — LISINOPRIL 5 MG PO TABS: ORAL_TABLET | ORAL | 0 refills | Status: AC

## 2021-01-27 NOTE — Discharge Instructions (Addendum)
Keep the neurology appointment for next week.  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  Take B12 vitamin liquid or dissolvable pill under your tongue, about 1000 mcg per day since you loose it from drinking a lot.

## 2021-01-27 NOTE — ED Provider Notes (Signed)
[facility]    CSN: [id number] Arrival date & time: 01/27/21  0915      History   Chief Complaint Chief Complaint  Patient presents with  . Hand Numbness  . Arm Numbness    HPI Barry Peterson is a 60 y.o. male who comes in due to having numbness on his hands x 1 month and has seen his PCP who cant pin point where is coming from and was referred to see a neurologist and has apt with Neurologist next week.  He is also on gabapentin and Lisinopril but does not know what his dose is. Has not taken his lisinopril in a couple of days. He has also continued drinking 3-4 beers a day and more on the weekends. Cant hold on to the handles of the machinery he uses at work, and has told them he could not return to work, but his PCP already has given him a note for that. When I asked him what could I do for him today, he replied " I really dont know"  He is concerned the numbness is permanent and will not be able to work. Has been drinking heavy since age 54 and had 4 DUI's in the past.  His PCP did lab work and all is normal, including his liver function. He denies neck pain or pain radiating to his hands.  His BP is controlled on his meds History reviewed. No pertinent past medical history.  Patient Active Problem List   Diagnosis Date Noted  . Alcohol use disorder, moderate, in controlled environment, dependence (HCC) 03/12/2020    History reviewed. No pertinent surgical history.   Home Medications    Prior to Admission medications   Medication Sig Start Date End Date Taking? Authorizing Provider  lisinopril (ZESTRIL) 5 MG tablet One a day, but he does not know his dosage 01/27/21  Yes Rodriguez-Southworth, Nettie Elm, PA-C  hydrOXYzine (ATARAX/VISTARIL) 25 MG tablet Take 1 tablet (25 mg total) by mouth every 6 (six) hours. Prn itching 04/25/15   Linna Hoff, MD    Family History History reviewed. No pertinent family history.  Social History Social History   Tobacco Use   . Smoking status: Former Smoker    Types: Cigarettes    Quit date: 03/27/1984    Years since quitting: 36.8  Substance Use Topics  . Alcohol use: Yes    Alcohol/week: 3.0 standard drinks    Types: 3 Cans of beer per week    Comment: 16 beers on the weekend   . Drug use: Yes    Types: Marijuana    Comment: 1 x monthly . Has lost his license due to this     Allergies   Patient has no known allergies.   Review of Systems Review of Systems  Respiratory: Negative for chest tightness and shortness of breath.   Cardiovascular: Negative for chest pain and leg swelling.  Neurological: Positive for weakness and numbness. Negative for headaches.       Denies numbness of feet     Physical Exam Triage Vital Signs ED Triage Vitals  Enc Vitals Group     BP 01/27/21 0945 (!) 166/113     Pulse Rate 01/27/21 0945 90     Resp 01/27/21 0945 17     Temp 01/27/21 0945 98.3 F (36.8 C)     Temp Source 01/27/21 0945 Oral     SpO2 01/27/21 0945 95 %     Weight --  Height --      Head Circumference --      Peak Flow --      Pain Score 01/27/21 0944 0     Pain Loc --      Pain Edu? --      Excl. in GC? --    No data found.  Updated Vital Signs BP (!) 166/113 (BP Location: Right Arm)   Pulse 90   Temp 98.3 F (36.8 C) (Oral)   Resp 17   SpO2 95%   Visual Acuity Right Eye Distance:   Left Eye Distance:   Bilateral Distance:    Right Eye Near:   Left Eye Near:    Bilateral Near:     Physical Exam Vitals and nursing note reviewed.  Constitutional:      Appearance: He is normal weight.  HENT:     Head: Normocephalic.     Right Ear: External ear normal.     Left Ear: External ear normal.  Eyes:     General: No scleral icterus.    Conjunctiva/sclera: Conjunctivae normal.  Cardiovascular:     Rate and Rhythm: Normal rate and regular rhythm.     Heart sounds: No murmur heard.   Pulmonary:     Effort: Pulmonary effort is normal.     Breath sounds: Normal breath  sounds.  Musculoskeletal:        General: Normal range of motion.     Cervical back: Neck supple. No tenderness.  Skin:    General: Skin is warm and dry.     Findings: Bruising present. No rash.     Comments: L forearm and L knee  Neurological:     Mental Status: He is alert and oriented to person, place, and time.     Sensory: Sensory deficit present.     Gait: Gait normal.     Deep Tendon Reflexes: Reflexes normal.     Comments: L arm strength 4/5. R 5/5  Psychiatric:        Behavior: Behavior normal.        Thought Content: Thought content normal.        Judgment: Judgment normal.     Comments: anxious      UC Treatments / Results  Labs (all labs ordered are listed, but only abnormal results are displayed) Labs Reviewed - No data to display  EKG   Radiology No results found.  Procedures Procedures (including critical care time)  Medications Ordered in UC Medications - No data to display  Initial Impression / Assessment and Plan / UC Course  I have reviewed the triage vital signs and the nursing notes. Advised to take his Lisinopril as soon as he gets home and monitors his BP Has chronic hand numbness and needs works up with specialist. I advised him to stop drinking, take some Vit B12, and to call his PCP and request if he could in crease the Gabapentin until he sees the neurologist.    Final Clinical Impressions(s) / UC Diagnoses   Final diagnoses:  Paresthesia of both hands     Discharge Instructions     Keep the neurology appointment for next week.  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  Take B12 vitamin liquid or dissolvable pill under your tongue, about 1000 mcg per day since you loose it from drinking a lot.     ED Prescriptions    Medication Sig Dispense Auth. Provider   lisinopril (ZESTRIL) 5 MG tablet One  a day, but he does not know his dosage 1 tablet Rodriguez-Southworth, Nettie Elm, PA-C     I have reviewed the PDMP  during this encounter.   Garey Ham, PA-C 01/27/21 1239

## 2021-01-27 NOTE — ED Triage Notes (Signed)
Pt presents with arm and hand numbness xs 1 months. States has seen PCP 3 times and is being sent to neuro for eval, but unable to see neurology until next week.

## 2021-03-02 ENCOUNTER — Encounter: Payer: Self-pay | Admitting: Neurology

## 2021-05-03 ENCOUNTER — Encounter: Payer: Self-pay | Admitting: Neurology

## 2021-05-03 ENCOUNTER — Other Ambulatory Visit (INDEPENDENT_AMBULATORY_CARE_PROVIDER_SITE_OTHER): Payer: 59

## 2021-05-03 ENCOUNTER — Other Ambulatory Visit: Payer: Self-pay

## 2021-05-03 ENCOUNTER — Ambulatory Visit: Payer: 59 | Admitting: Neurology

## 2021-05-03 VITALS — BP 132/87 | HR 97 | Ht 70.0 in | Wt 165.8 lb

## 2021-05-03 DIAGNOSIS — G959 Disease of spinal cord, unspecified: Secondary | ICD-10-CM

## 2021-05-03 DIAGNOSIS — M4802 Spinal stenosis, cervical region: Secondary | ICD-10-CM

## 2021-05-03 DIAGNOSIS — R292 Abnormal reflex: Secondary | ICD-10-CM

## 2021-05-03 LAB — B12 AND FOLATE PANEL
Folate: >24.4 ng/mL (ref >5.9)
Vitamin B-12: 277 pg/mL (ref 211–911)

## 2021-05-03 MED ORDER — BACLOFEN 10 MG PO TABS: ORAL_TABLET | ORAL | 3 refills | Status: AC

## 2021-05-03 NOTE — Patient Instructions (Addendum)
Check labs  MRI cervical spine without contrast  Start baclofen 10mg  - Take 1 tablet at bedtime x 1 week, then increase 1 tablet twice daily

## 2021-05-03 NOTE — Progress Notes (Signed)
[facility] Clinic Note - Initial Visit   Date: 05/03/21  Barry Peterson MRN: [id number] DOB: [birth year]   Dear Dr. Clovis Riley:  Thank you for your kind referral of Barry Peterson for consultation of bilateral hand tingling. Although his history is well known to you, please allow [place] to reiterate it for the purpose of our medical record. The patient was accompanied to the clinic by self.   History of Present Illness: Barry Peterson is a 60 y.o. right-handed male with hypertension and alcohol abuse presenting for evaluation of bilateral hand tingling.   Starting around March 2022, he began having numbness in the fingertips, which is constant.  He also has tingling with finger movement.  He has some difficulty with fine motor tasks because of numbness and weakness in the hands.  He denies neck pain or radicular arm pain.  No muscle cramps.  He also has tingling and numbness involving the toes.  His balance is fair.  He has noticed that when he closes his eyes to take a shower, his always needs to hold onto a bar.  He began using a shower stool for the past month.  He has not fallen.   Prior NCS/EMG by Dr. Neale Burly showed mild carpal tunnel syndrome.  He saw Dr. Merlyn Lot for these symptoms who offered a trial of steroid injection, which did not help. He was referred here for further evaluation.  He drives a forklift.  He lives alone in a one-level home.  He would drink 2-3 beers daily during the weekdays, and more over the weekends, up to 8 beers in a sitting, which he has been doing for the past 40 year.  After his symptoms started in March, he quit drinking during the weekdays, continues to drink over the weekend.    Past Medical History:  Diagnosis Date   Hypertension     History reviewed. No pertinent surgical history.   Medications:  Outpatient Encounter Medications as of 05/03/2021  Medication Sig   lisinopril-hydrochlorothiazide (ZESTORETIC) 20-12.5  MG tablet Take 1 tablet by mouth daily.   meloxicam (MOBIC) 7.5 MG tablet Take 7.5 mg by mouth daily.   omega-3 fish oil (MAXEPA) 1000 MG CAPS capsule 1 capsule   sildenafil (REVATIO) 20 MG tablet Take 20 mg by mouth as needed.   verapamil (VERELAN PM) 240 MG 24 hr capsule Take 240 mg by mouth daily.   hydrOXYzine (ATARAX/VISTARIL) 25 MG tablet Take 1 tablet (25 mg total) by mouth every 6 (six) hours. Prn itching (Patient not taking: Reported on 05/03/2021)   lisinopril (ZESTRIL) 5 MG tablet One a day, but he does not know his dosage (Patient not taking: Reported on 05/03/2021)   No facility-administered encounter medications on file as of 05/03/2021.    Allergies: No Known Allergies  Family History: Family History  Problem Relation Age of Onset   Stroke Maternal Grandmother     Social History: Social History   Tobacco Use   Smoking status: Former    Types: Cigarettes    Quit date: 03/27/1984    Years since quitting: 37.1   Smokeless tobacco: Never  Substance Use Topics   Alcohol use: Yes    Alcohol/week: 3.0 standard drinks    Types: 3 Cans of beer per week    Comment: 16 beers on the weekend    Drug use: Yes    Types: Marijuana    Comment: 1 x monthly . Has lost his license due to this  Social History   Social History Narrative   Right handed   One story home, Alone    Vital Signs:  BP 132/87 (BP Location: Left Arm, Patient Position: Sitting, Cuff Size: Normal)   Pulse 97   Ht 5\' 10"  (1.778 m)   Wt 165 lb 12.8 oz (75.2 kg)   SpO2 95%   BMI 23.79 kg/m    Neurological Exam: MENTAL STATUS including orientation to time, place, person, recent and remote memory, attention span and concentration, language, and fund of knowledge is normal.  Speech is not dysarthric.  CRANIAL NERVES: II:  No visual field defects.  III-IV-VI: Pupils equal round and reactive to light.  Normal conjugate, extra-ocular eye movements in all directions of gaze.  No nystagmus.  No ptosis.   V:   Normal facial sensation.    VII:  Normal facial symmetry and movements.   VIII:  Normal hearing and vestibular function.   IX-X:  Normal palatal movement.   XI:  Normal shoulder shrug and head rotation.   XII:  Normal tongue strength and range of motion, no deviation or fasciculation.  MOTOR:  Mild intrinsic hand muscle atrophy bilaterally. No fasciculations or abnormal movements.  No pronator drift.   Upper Extremity:  Right  Left  Deltoid  5/5   5/5   Biceps  5/5   5/5   Triceps  5/5   5/5   Infraspinatus 5/5  5/5  Medial pectoralis 5/5  5/5  Wrist extensors  5/5   5/5   Wrist flexors  5/5   5/5   Finger extensors  4/5   4/5   Finger flexors  4/5   4/5   Dorsal interossei  4/5   4/5   Abductor pollicis  4/5   4/5   Tone (Ashworth scale)  0+  0+   Lower Extremity:  Right  Left  Hip flexors  5/5   5/5   Hip extensors  5/5   5/5   Adductor 5/5  5/5  Abductor 5/5  5/5  Knee flexors  5/5   5/5   Knee extensors  5/5   5/5   Dorsiflexors  5/5   5/5   Plantarflexors  5/5   5/5   Toe extensors  5/5   5/5   Toe flexors  5/5   5/5   Tone (Ashworth scale)  0+  0+   MSRs:  Right        Left                  brachioradialis 3+  3+  biceps 3+  3+  triceps 3+  3+  patellar 3+  3+  ankle jerk 3+  3+  Hoffman yes  yes  plantar response mute  mute  1-2 beat ankle clonus bilaterally  SENSORY:  Vibration reduced at the knees, trace at the MCP.  Temperature and pin prick reduced over the fingertip and below the knees.  Rhomberg sign is present  COORDINATION/GAIT: Dysmetria with finger-to- nose-finger testing.  Intact rapid alternating movements bilaterally.  Gait appears wide-based, stiff appearing, unassisted.   IMPRESSION: Cervical myelopathy manifesting with bilateral hand paresthesias and gait instability.  Exam shows prominent brisk reflexes throughout with increased tone.  Imaging will be ordered to evaluate for compressive pathology - MRI cervical spine wo contrast ASAP  -  Start baclofen 10mg  take 1 tablet at bedtime x 1 week, then increase 1 tablet twice daily  - Fall precautions discussed  2.  Alcohol abuse  - Check vitamin B1, folate, vitamin B12  - Encouraged patient to try to abstain from alcohol   3. 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    Thank you for allowing me to participate in patient's care.  If I can answer any additional questions, I would be pleased to do so.    Sincerely,    Harlan Ervine K. Allena Katz, DO

## 2021-05-10 ENCOUNTER — Telehealth: Payer: Self-pay

## 2021-05-10 LAB — VITAMIN B1: Vitamin B1 (Thiamine): 13 nmol/L (ref 8–30)

## 2021-05-10 NOTE — Telephone Encounter (Signed)
Called patient and informed him on labs and low normal B12. Informed patient per Dr. Allena Katz to start an OTC B12 daily . I asked patient about his MRI and patient stated [facility] called him on Friday and he hasn't called them back yet. I informed patient that Dr. Allena Katz ordered it as ASAP and to please give them a call. Patient is aware that he needs to call [facility] asap and patient verbalized understanding and will do that now.

## 2021-05-10 NOTE — Telephone Encounter (Signed)
-----   Message from Glendale Chard, DO sent at 05/10/2021 11:57 AM EDT ----- Please let pt know his labs are within normal levels, except vitamin B12 is low-normal.  I recommend he start OTC vitamin B12 daily.  Can you also follow-up on his MRI cervical spine? This was ordered as an ASAP study. Thanks.

## 2021-05-16 ENCOUNTER — Ambulatory Visit
Admission: RE | Admit: 2021-05-16 | Discharge: 2021-05-16 | Disposition: A | Discharge status: Home / Self Care | Payer: 59 | Source: Ambulatory Visit | Attending: Neurology | Admitting: Neurology

## 2021-05-16 ENCOUNTER — Other Ambulatory Visit: Payer: Self-pay

## 2021-05-16 DIAGNOSIS — M4802 Spinal stenosis, cervical region: Secondary | ICD-10-CM

## 2021-05-16 DIAGNOSIS — R292 Abnormal reflex: Secondary | ICD-10-CM

## 2021-05-17 ENCOUNTER — Telehealth: Payer: Self-pay | Admitting: Neurology

## 2021-05-17 DIAGNOSIS — M4802 Spinal stenosis, cervical region: Secondary | ICD-10-CM

## 2021-05-17 DIAGNOSIS — G959 Disease of spinal cord, unspecified: Secondary | ICD-10-CM

## 2021-05-17 NOTE — Telephone Encounter (Signed)
Pt had an MRI done and would like results. He has an appt today and would like to tell doctor

## 2021-05-17 NOTE — Telephone Encounter (Signed)
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  This is what is causing patients hand paresthesias, spasticity, and gait difficulty. He denies any new symptoms since he was last seen in the office. He will be referred for urgent evaluation by [facility] for decompression.

## 2021-05-17 NOTE — Telephone Encounter (Signed)
Urgent referral has been sent to [facility].

## 2021-07-27 ENCOUNTER — Other Ambulatory Visit: Payer: Self-pay

## 2021-07-27 ENCOUNTER — Emergency Department (INDEPENDENT_AMBULATORY_CARE_PROVIDER_SITE_OTHER)
Admission: EM | Admit: 2021-07-27 | Discharge: 2021-07-27 | Disposition: A | Discharge status: Home / Self Care | Payer: PRIVATE HEALTH INSURANCE | Source: Home / Self Care

## 2021-07-27 DIAGNOSIS — R0602 Shortness of breath: Secondary | ICD-10-CM | POA: Diagnosis not present

## 2021-07-27 NOTE — ED Triage Notes (Signed)
Pt c/o shortness of breath and fatigue today. Says he had surgery on his c spine 6 months ago and today was his first day returning to work today. Said he's been pretty sedentary these last 6 months. No hx of asthma or other breathing/cardiac issues.

## 2021-07-27 NOTE — ED Provider Notes (Signed)
[facility]    CSN: [id number] Arrival date & time: 07/27/21  6144      History   Chief Complaint Chief Complaint  Patient presents with   Shortness of Breath    HPI Barry Peterson is a 60 y.o. male.   HPI 60 year old male presents with shortness of breath and fatigue for 1 day/today.  Patient reports he had surgery on his cervical spine 6 months ago and this is his first day returning to work.  Patient reports he has been sedentary for the past 6 months.  Patient denies dyspnea with exertion or other anginal equivalents.  Past Medical History:  Diagnosis Date   Hypertension     Patient Active Problem List   Diagnosis Date Noted   Alcohol use disorder, moderate, in controlled environment, dependence (HCC) 03/12/2020    History reviewed. No pertinent surgical history.     Home Medications    Prior to Admission medications   Medication Sig Start Date End Date Taking? Authorizing Provider  baclofen (LIORESAL) 10 MG tablet Take 1 tablet at bedtime x 1 week, then increase 1 tablet twice daily 05/03/21   Nita Sickle K, DO  hydrOXYzine (ATARAX/VISTARIL) 25 MG tablet Take 1 tablet (25 mg total) by mouth every 6 (six) hours. Prn itching Patient not taking: Reported on 05/03/2021 04/25/15   Linna Hoff, MD  lisinopril (ZESTRIL) 5 MG tablet One a day, but he does not know his dosage Patient not taking: Reported on 05/03/2021 01/27/21   Rodriguez-Southworth, Nettie Elm, PA-C  lisinopril-hydrochlorothiazide (ZESTORETIC) 20-12.5 MG tablet Take 1 tablet by mouth daily. 03/17/21   [provider]  meloxicam (MOBIC) 7.5 MG tablet Take 7.5 mg by mouth daily. 04/05/21   [provider]  omega-3 fish oil (MAXEPA) 1000 MG CAPS capsule 1 capsule    [provider]  sildenafil (REVATIO) 20 MG tablet Take 20 mg by mouth as needed.    [provider]  verapamil (VERELAN PM) 240 MG 24 hr capsule Take 240 mg by mouth daily. 03/16/21   [provider]    Family History Family History  Problem Relation Age of Onset   Stroke Maternal Grandmother     Social History Social History   Tobacco Use   Smoking status: Former    Types: Cigarettes    Quit date: 03/27/1984    Years since quitting: 37.3   Smokeless tobacco: Never  Substance Use Topics   Alcohol use: Yes    Alcohol/week: 3.0 standard drinks    Types: 3 Cans of beer per week    Comment: 16 beers on the weekend    Drug use: Yes    Types: Marijuana    Comment: 1 x monthly . Has lost his license due to this     Allergies   Patient has no known allergies.   Review of Systems Review of Systems  Respiratory:  Positive for shortness of breath.   All other systems reviewed and are negative.   Physical Exam Triage Vital Signs ED Triage Vitals  Enc Vitals Group     BP 07/27/21 0946 132/87     Pulse Rate 07/27/21 0946 (!) 109     Resp 07/27/21 0946 19     Temp 07/27/21 0946 98.3 F (36.8 C)     Temp Source 07/27/21 0946 Oral     SpO2 07/27/21 0946 98 %     Weight --      Height --  Head Circumference --      Peak Flow --      Pain Score 07/27/21 0949 0     Pain Loc --      Pain Edu? --      Excl. in GC? --    No data found.  Updated Vital Signs BP 132/87 (BP Location: Right Arm)   Pulse (!) 109   Temp 98.3 F (36.8 C) (Oral)   Resp 19   SpO2 98%   Physical Exam Vitals and nursing note reviewed.  Constitutional:      Appearance: Normal appearance. He is normal weight.  HENT:     Head: Normocephalic and atraumatic.     Nose: Nose normal.     Mouth/Throat:     Mouth: Mucous membranes are moist.     Pharynx: Oropharynx is clear.  Eyes:     Extraocular Movements: Extraocular movements intact.     Conjunctiva/sclera: Conjunctivae normal.     Pupils: Pupils are equal, round, and reactive to light.  Cardiovascular:     Rate and Rhythm: Normal rate and regular rhythm.     Pulses: Normal pulses.     Heart sounds: Normal heart sounds.   Pulmonary:     Effort: Pulmonary effort is normal.     Breath sounds: Normal breath sounds.  Musculoskeletal:        General: Normal range of motion.     Cervical back: Normal range of motion and neck supple.  Skin:    General: Skin is warm and dry.  Neurological:     General: No focal deficit present.     Mental Status: He is alert and oriented to person, place, and time.     UC Treatments / Results  Labs (all labs ordered are listed, but only abnormal results are displayed) Labs Reviewed - No data to display  EKG   Radiology No results found.  Procedures Procedures (including critical care time)  Medications Ordered in UC Medications - No data to display  Initial Impression / Assessment and Plan / UC Course  I have reviewed the triage vital signs and the nursing notes.  Pertinent labs & imaging results that were available during my care of the patient were reviewed by me and considered in my medical decision making (see chart for details).     MDM: 1.  Shortness of breath-Advised patient to gradually return to daily exercise as he can tolerate.  Work note provided per patient request.  Patient discharged home, hemodynamically stable. Final Clinical Impressions(s) / UC Diagnoses   Final diagnoses:  SOB (shortness of breath)   Discharge Instructions   None    ED Prescriptions   None    PDMP not reviewed this encounter.   Trevor Iha, FNP 07/27/21 1111

## 2022-09-22 IMAGING — MR MR CERVICAL SPINE W/O CM
4 of 5 series · 27 of 48 positions shown · non-contrast
Comparison: None.

CLINICAL DATA: Spinal stenosis, C-spine

EXAM:
MRI CERVICAL SPINE WITHOUT CONTRAST
TECHNIQUE: Multiplanar, multisequence MR imaging of the cervical spine was
performed. No intravenous contrast was administered.

[Series 5: T2 · sagittal · 3.0mm · 0.55mm/px · 6 of 15 slices shown (1 of 2)]
[im 1/15]
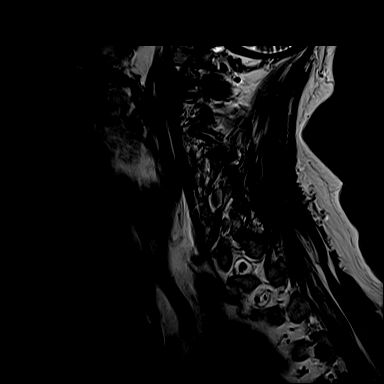
[im 3/15]
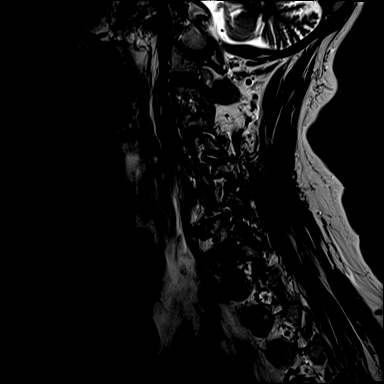
[im 6/15]
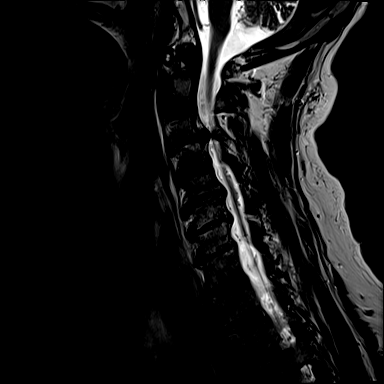
[im 9/15]
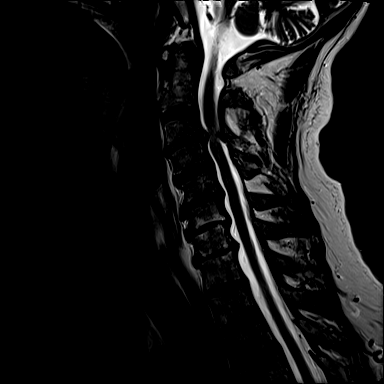
[im 12/15]
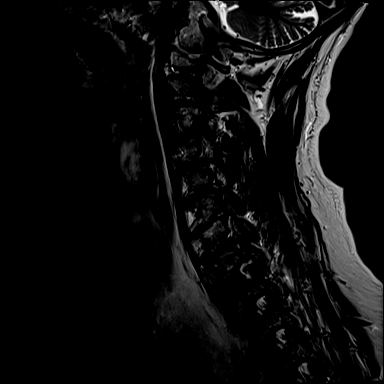
[im 15/15]
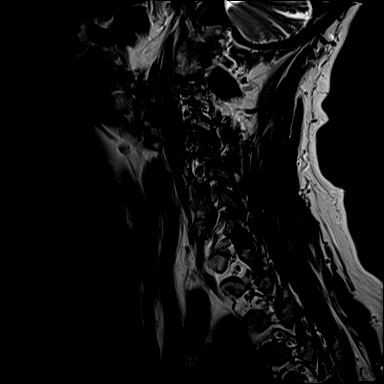

[Series 6: T1 · sagittal · 3.0mm · 0.66mm/px · 7 of 15 slices shown]
[im 1/15]
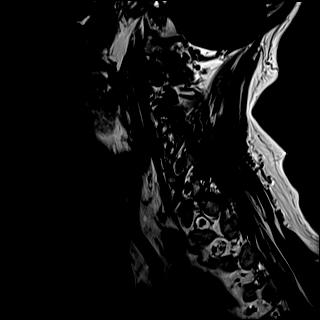
[im 3/15]
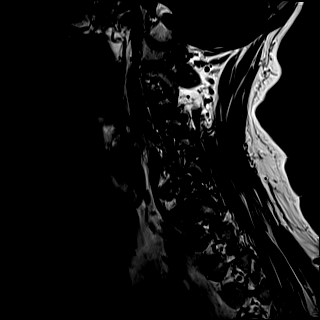
[im 5/15]
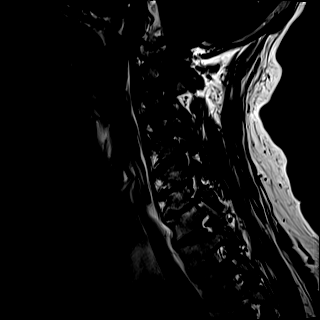
[im 8/15]
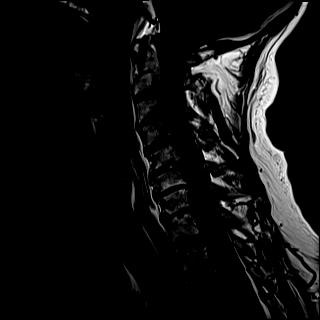
[im 10/15]
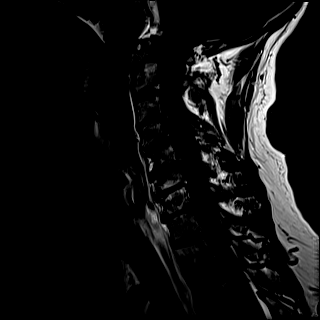
[im 12/15]
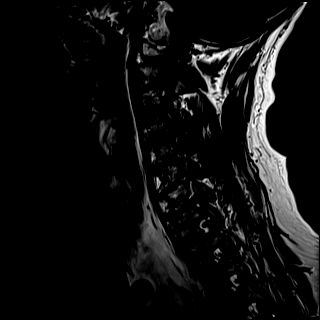
[im 15/15]
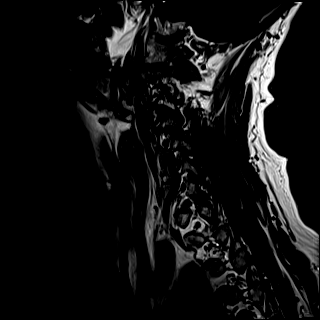

[Series 7: STIR · sagittal · 3.0mm · 0.33mm/px · 6 of 15 slices shown]
[im 1/15]
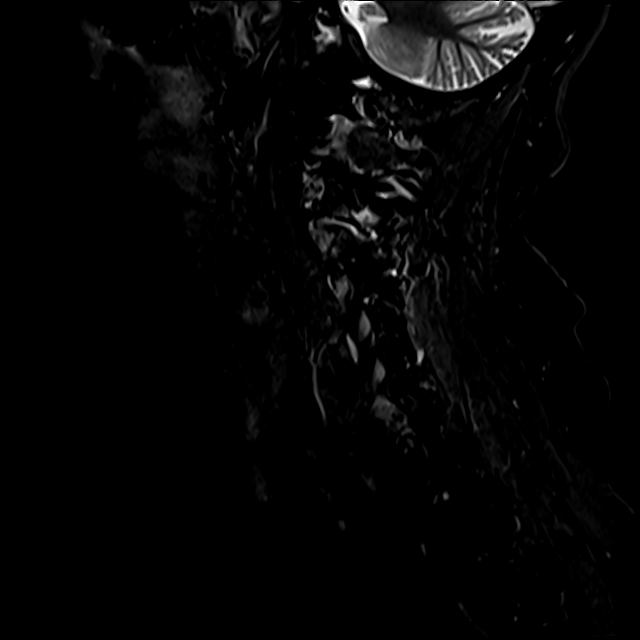
[im 3/15]
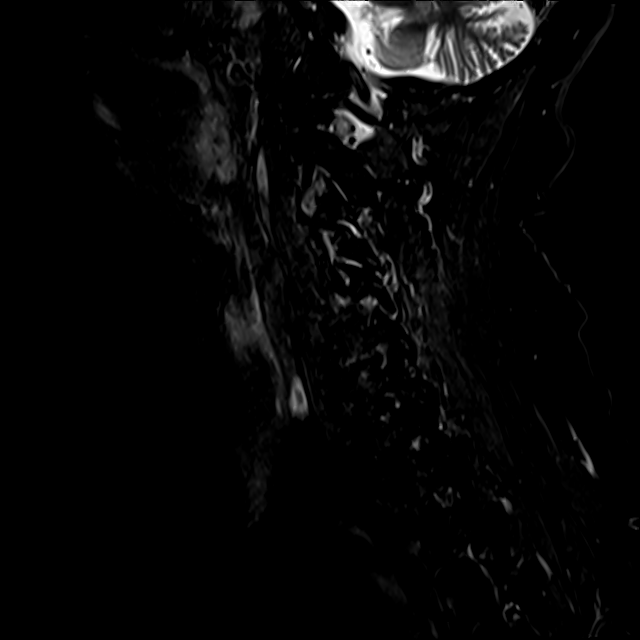
[im 5/15]
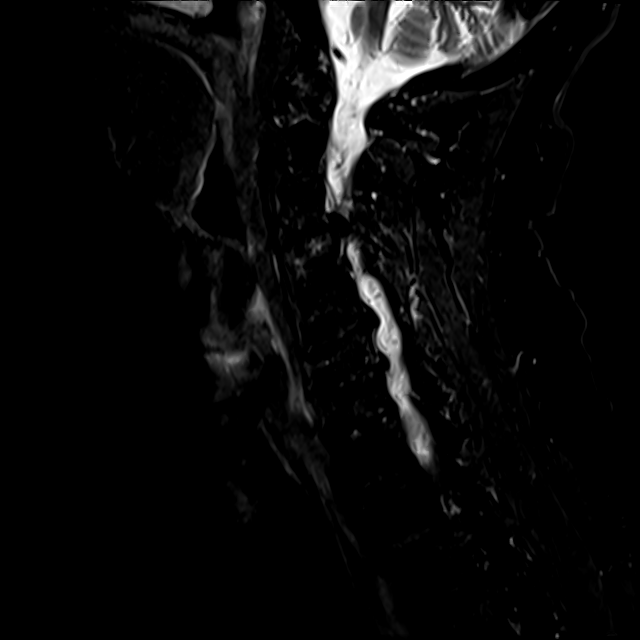
[im 8/15]
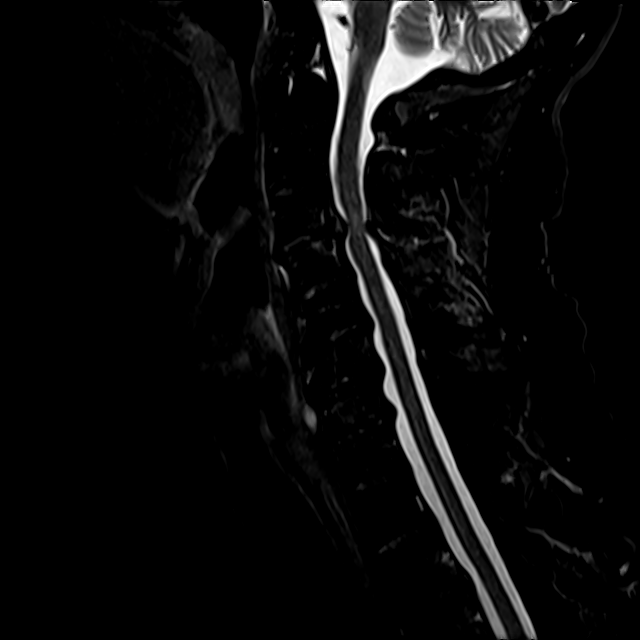
[im 10/15]
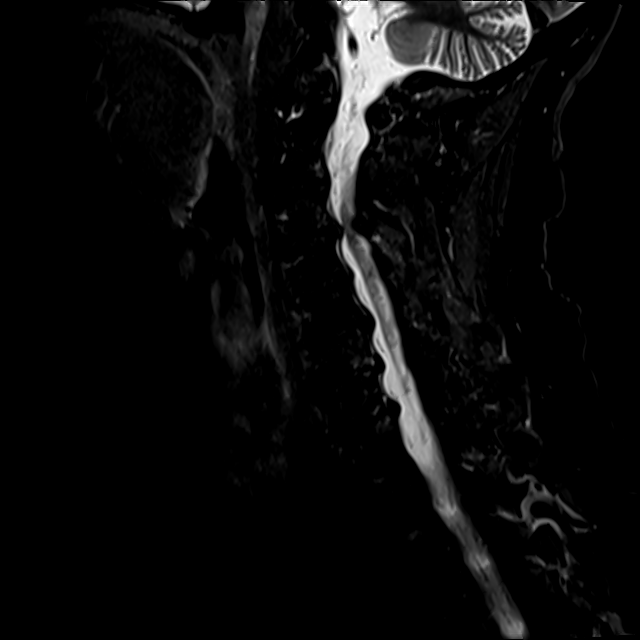
[im 12/15]
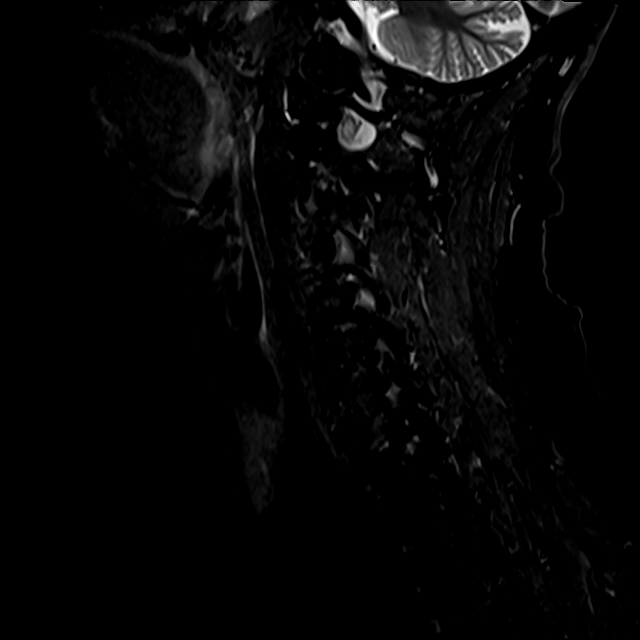

[Series 8: T2 · axial · 3.0mm · 0.50mm/px · z∈[-107,-12]mm · 8 of 30 slices shown (2 of 2)]
[im 1/30]
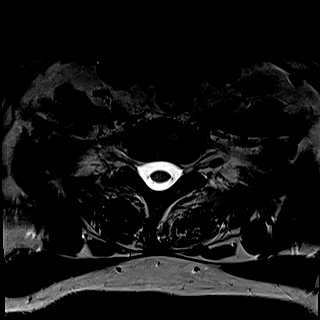
[im 5/30]
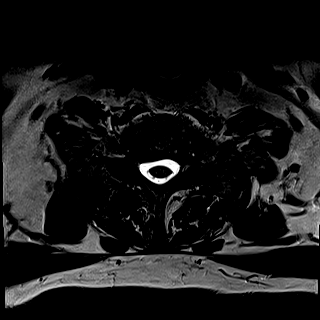
[im 9/30]
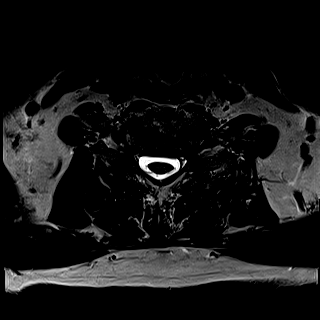
[im 14/30]
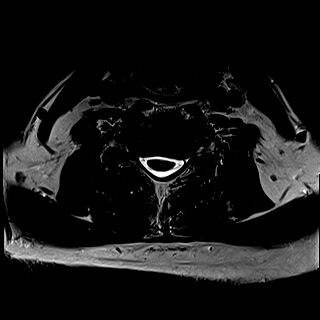
[im 16/30]
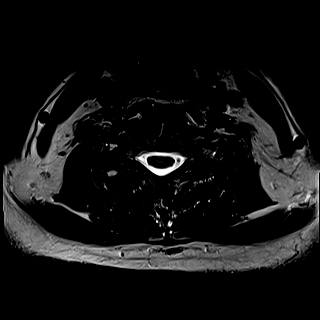
[im 21/30]
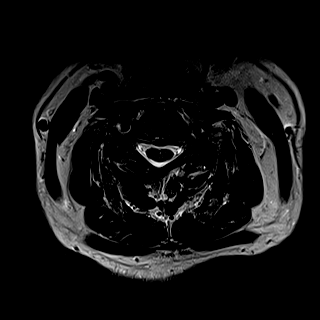
[im 25/30]
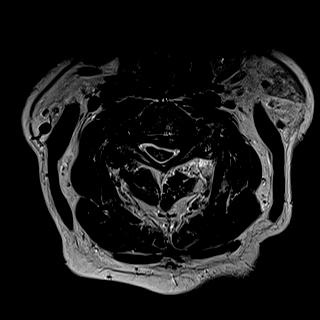
[im 30/30]
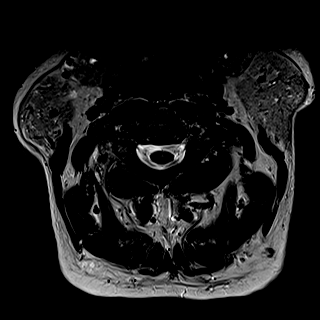

[27 of 48 positions shown; findings below may reference images not displayed]

FINDINGS: Alignment: Mild degenerative listhesis, for example retrolisthesis
at C5-C6 and C6-C7.

Vertebrae: Vertebral body heights are maintained apart from
degenerative endplate irregularity. Degenerative endplate marrow
changes are present including endplate marrow edema at C3-C4. No
suspicious osseous lesion.

Cord: Mild T2 hyperintensity at C3-C4 secondary to compression.

Posterior Fossa, vertebral arteries, paraspinal tissues:
Unremarkable.

Disc levels:

C2-C3: Minimal disc bulge with endplate osteophytes. Right greater
than left facet hypertrophy. There is a joint effusion on the right.
No significant canal or foraminal stenosis.

C3-C4: Disc bulge with superimposed left paracentral/foraminal
protrusion and endplate osteophytes. Uncovertebral and facet
hypertrophy. Marked canal stenosis with cord compression. Marked
left greater than right foraminal stenosis.

C4-C5: Disc bulge with endplate osteophytes. Uncovertebral and facet
hypertrophy. No significant canal stenosis. Mild foraminal stenosis.

C5-C6: Disc bulge with endplate osteophytes. Uncovertebral
hypertrophy. No significant canal stenosis. Marked foraminal
stenosis.

C6-C7: Disc bulge with endplate osteophytes. Uncovertebral
hypertrophy. No significant canal stenosis. Marked foraminal
stenosis.

C7-T1: Disc bulge with endplate osteophytes. Facet hypertrophy. No
significant canal stenosis. Moderate to marked foraminal stenosis.
IMPRESSION: Multilevel degenerative changes as detailed above. There is marked
canal stenosis with cord compression at C3-C4. Cord T2
hyperintensity at this level reflects edema or myelomalacia. There
is multilevel significant foraminal stenosis.

These results will be called to the ordering clinician or
representative by the Radiologist Assistant, and communication
documented in the PACS or [REDACTED].

## 2022-12-09 DIAGNOSIS — F1020 Alcohol dependence, uncomplicated: Secondary | ICD-10-CM | POA: Diagnosis not present

## 2023-01-06 DIAGNOSIS — I10 Essential (primary) hypertension: Secondary | ICD-10-CM | POA: Diagnosis not present

## 2023-01-06 DIAGNOSIS — R7303 Prediabetes: Secondary | ICD-10-CM | POA: Diagnosis not present

## 2023-01-06 DIAGNOSIS — Z23 Encounter for immunization: Secondary | ICD-10-CM | POA: Diagnosis not present

## 2023-01-06 DIAGNOSIS — H6123 Impacted cerumen, bilateral: Secondary | ICD-10-CM | POA: Diagnosis not present

## 2023-01-06 DIAGNOSIS — E782 Mixed hyperlipidemia: Secondary | ICD-10-CM | POA: Diagnosis not present

## 2023-02-06 DIAGNOSIS — F1020 Alcohol dependence, uncomplicated: Secondary | ICD-10-CM | POA: Diagnosis not present

## 2023-03-09 DIAGNOSIS — F1020 Alcohol dependence, uncomplicated: Secondary | ICD-10-CM | POA: Diagnosis not present

## 2023-04-20 DIAGNOSIS — F1021 Alcohol dependence, in remission: Secondary | ICD-10-CM | POA: Diagnosis not present

## 2023-05-25 DIAGNOSIS — F1021 Alcohol dependence, in remission: Secondary | ICD-10-CM | POA: Diagnosis not present

## 2023-06-22 DIAGNOSIS — F1021 Alcohol dependence, in remission: Secondary | ICD-10-CM | POA: Diagnosis not present

## 2023-07-12 DIAGNOSIS — E7800 Pure hypercholesterolemia, unspecified: Secondary | ICD-10-CM | POA: Diagnosis not present

## 2023-07-12 DIAGNOSIS — R7303 Prediabetes: Secondary | ICD-10-CM | POA: Diagnosis not present

## 2023-07-12 DIAGNOSIS — Z0000 Encounter for general adult medical examination without abnormal findings: Secondary | ICD-10-CM | POA: Diagnosis not present

## 2023-07-12 DIAGNOSIS — D509 Iron deficiency anemia, unspecified: Secondary | ICD-10-CM | POA: Diagnosis not present

## 2023-07-12 DIAGNOSIS — I10 Essential (primary) hypertension: Secondary | ICD-10-CM | POA: Diagnosis not present

## 2023-07-12 DIAGNOSIS — Z125 Encounter for screening for malignant neoplasm of prostate: Secondary | ICD-10-CM | POA: Diagnosis not present

## 2023-07-14 ENCOUNTER — Other Ambulatory Visit (HOSPITAL_COMMUNITY): Payer: Self-pay | Admitting: Family Medicine

## 2023-07-14 DIAGNOSIS — E7800 Pure hypercholesterolemia, unspecified: Secondary | ICD-10-CM

## 2023-07-17 DIAGNOSIS — Z1211 Encounter for screening for malignant neoplasm of colon: Secondary | ICD-10-CM | POA: Diagnosis not present

## 2023-07-26 DIAGNOSIS — F1021 Alcohol dependence, in remission: Secondary | ICD-10-CM | POA: Diagnosis not present

## 2023-07-27 ENCOUNTER — Ambulatory Visit (HOSPITAL_COMMUNITY)
Admission: RE | Admit: 2023-07-27 | Discharge: 2023-07-27 | Disposition: A | Discharge status: Home / Self Care | Payer: PRIVATE HEALTH INSURANCE | Source: Ambulatory Visit | Attending: Family Medicine | Admitting: Family Medicine

## 2023-07-27 DIAGNOSIS — E7800 Pure hypercholesterolemia, unspecified: Secondary | ICD-10-CM | POA: Insufficient documentation

## 2023-08-16 DIAGNOSIS — F1021 Alcohol dependence, in remission: Secondary | ICD-10-CM | POA: Diagnosis not present

## 2023-09-06 DIAGNOSIS — F1021 Alcohol dependence, in remission: Secondary | ICD-10-CM | POA: Diagnosis not present
